# Patient Record
Sex: Male | Born: 1970 | Race: White | Hispanic: No | Marital: Married
Health system: Southern US, Community
[De-identification: ages and names within clinical notes are randomized; demographics above are authoritative.]

---

## 2001-07-23 ENCOUNTER — Emergency Department (HOSPITAL_COMMUNITY): Admission: EM | Admit: 2001-07-23 | Discharge: 2001-07-23 | Payer: Self-pay | Admitting: Emergency Medicine

## 2001-07-29 ENCOUNTER — Ambulatory Visit (HOSPITAL_COMMUNITY): Admission: RE | Admit: 2001-07-29 | Discharge: 2001-07-29 | Payer: Self-pay | Admitting: General Surgery

## 2002-07-08 ENCOUNTER — Emergency Department (HOSPITAL_COMMUNITY): Admission: EM | Admit: 2002-07-08 | Discharge: 2002-07-08 | Payer: Self-pay | Admitting: *Deleted

## 2003-11-26 ENCOUNTER — Encounter: Admission: RE | Admit: 2003-11-26 | Discharge: 2003-11-26 | Payer: Self-pay | Admitting: Occupational Medicine

## 2008-01-06 ENCOUNTER — Emergency Department (HOSPITAL_COMMUNITY): Admission: EM | Admit: 2008-01-06 | Discharge: 2008-01-06 | Payer: Self-pay | Admitting: Emergency Medicine

## 2010-02-22 IMAGING — CR DG SHOULDER 2+V*R*
3 series · 3 of 3 positions shown · non-contrast
Comparison: None

CLINICAL DATA: Right shoulder injury and pain.

RIGHT SHOULDER - 2+ VIEW

[view not recorded (1 of 3)]
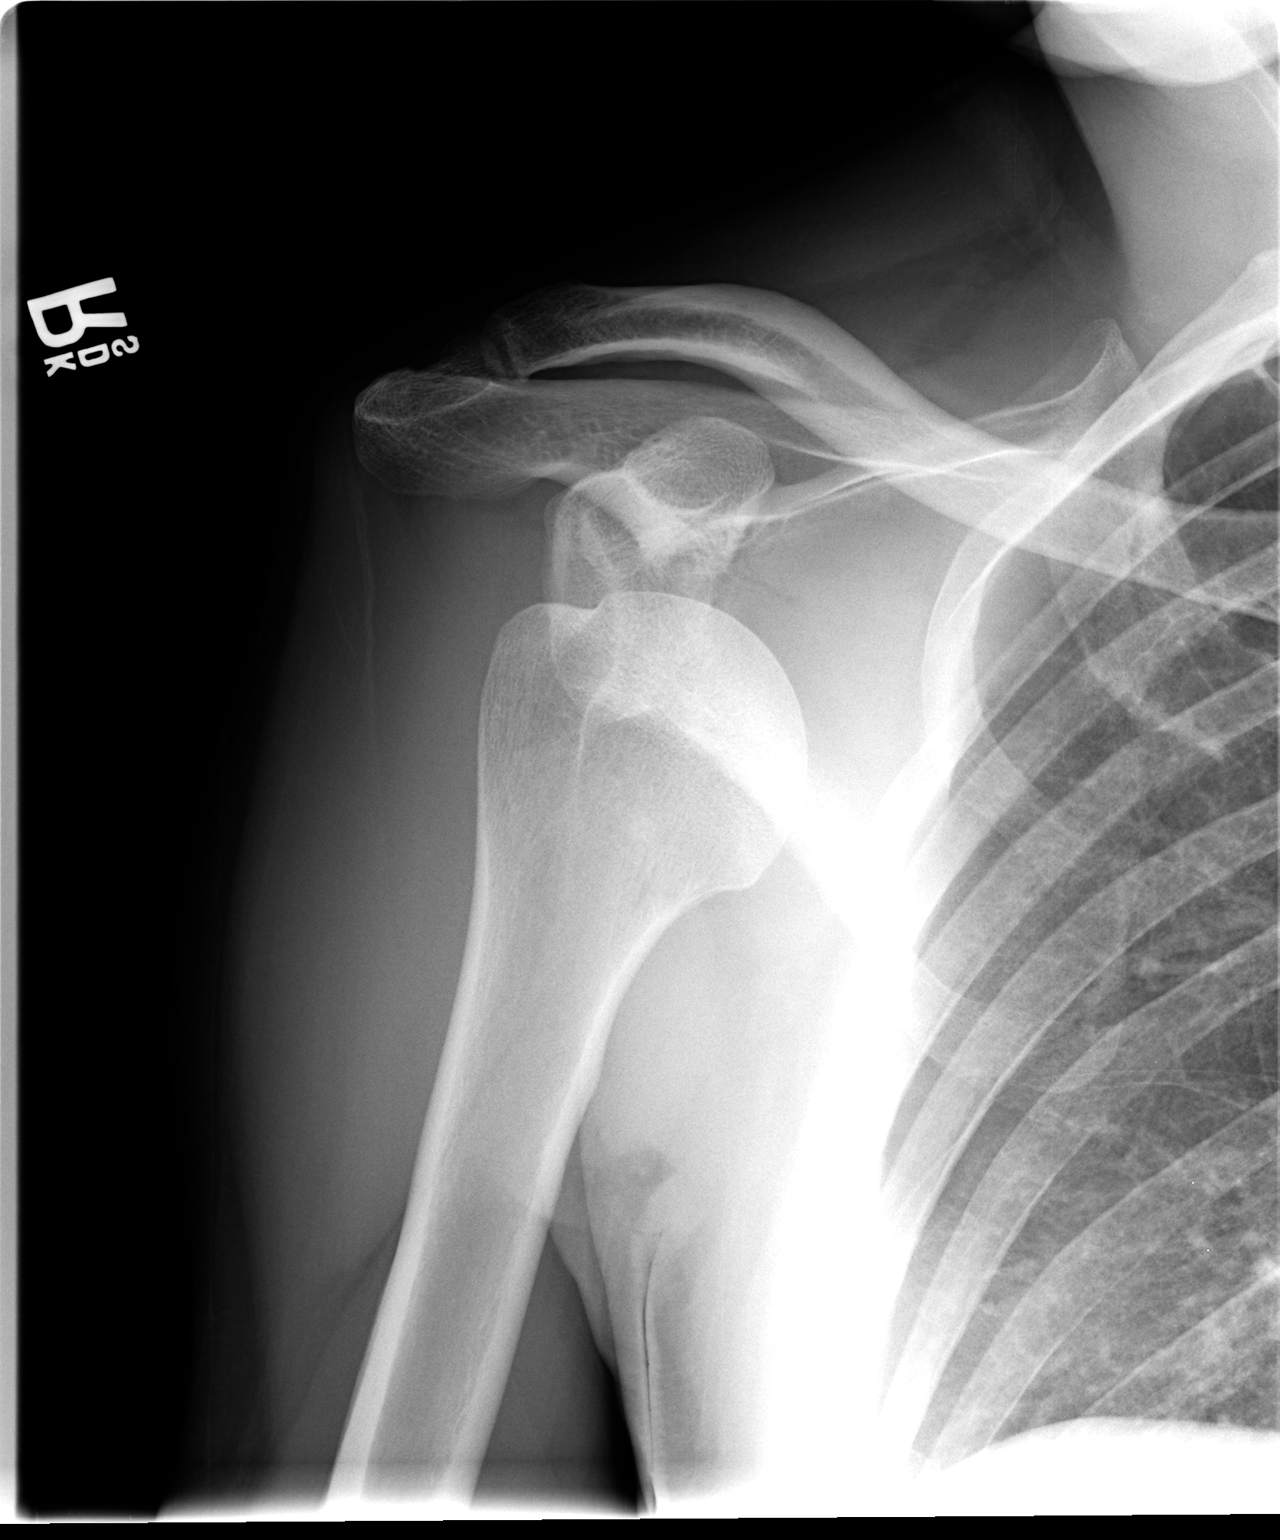

[view not recorded (2 of 3)]
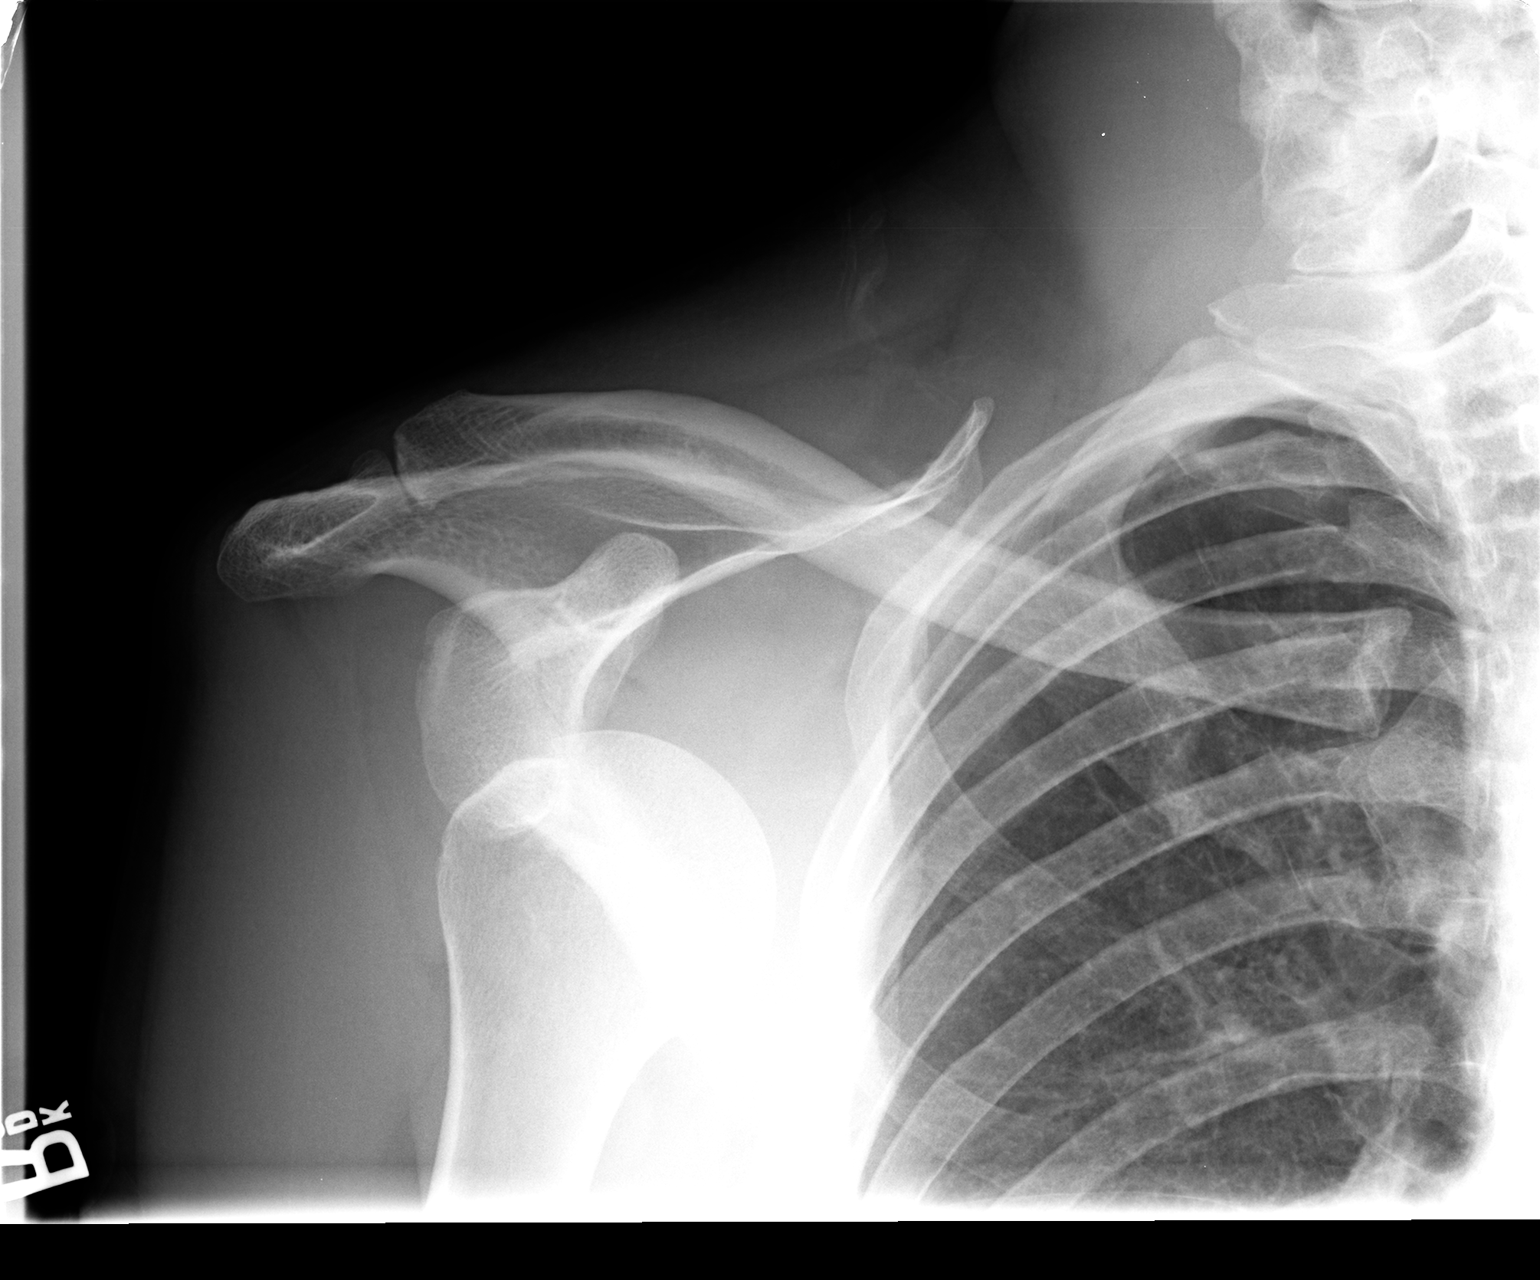

[view not recorded (3 of 3)]
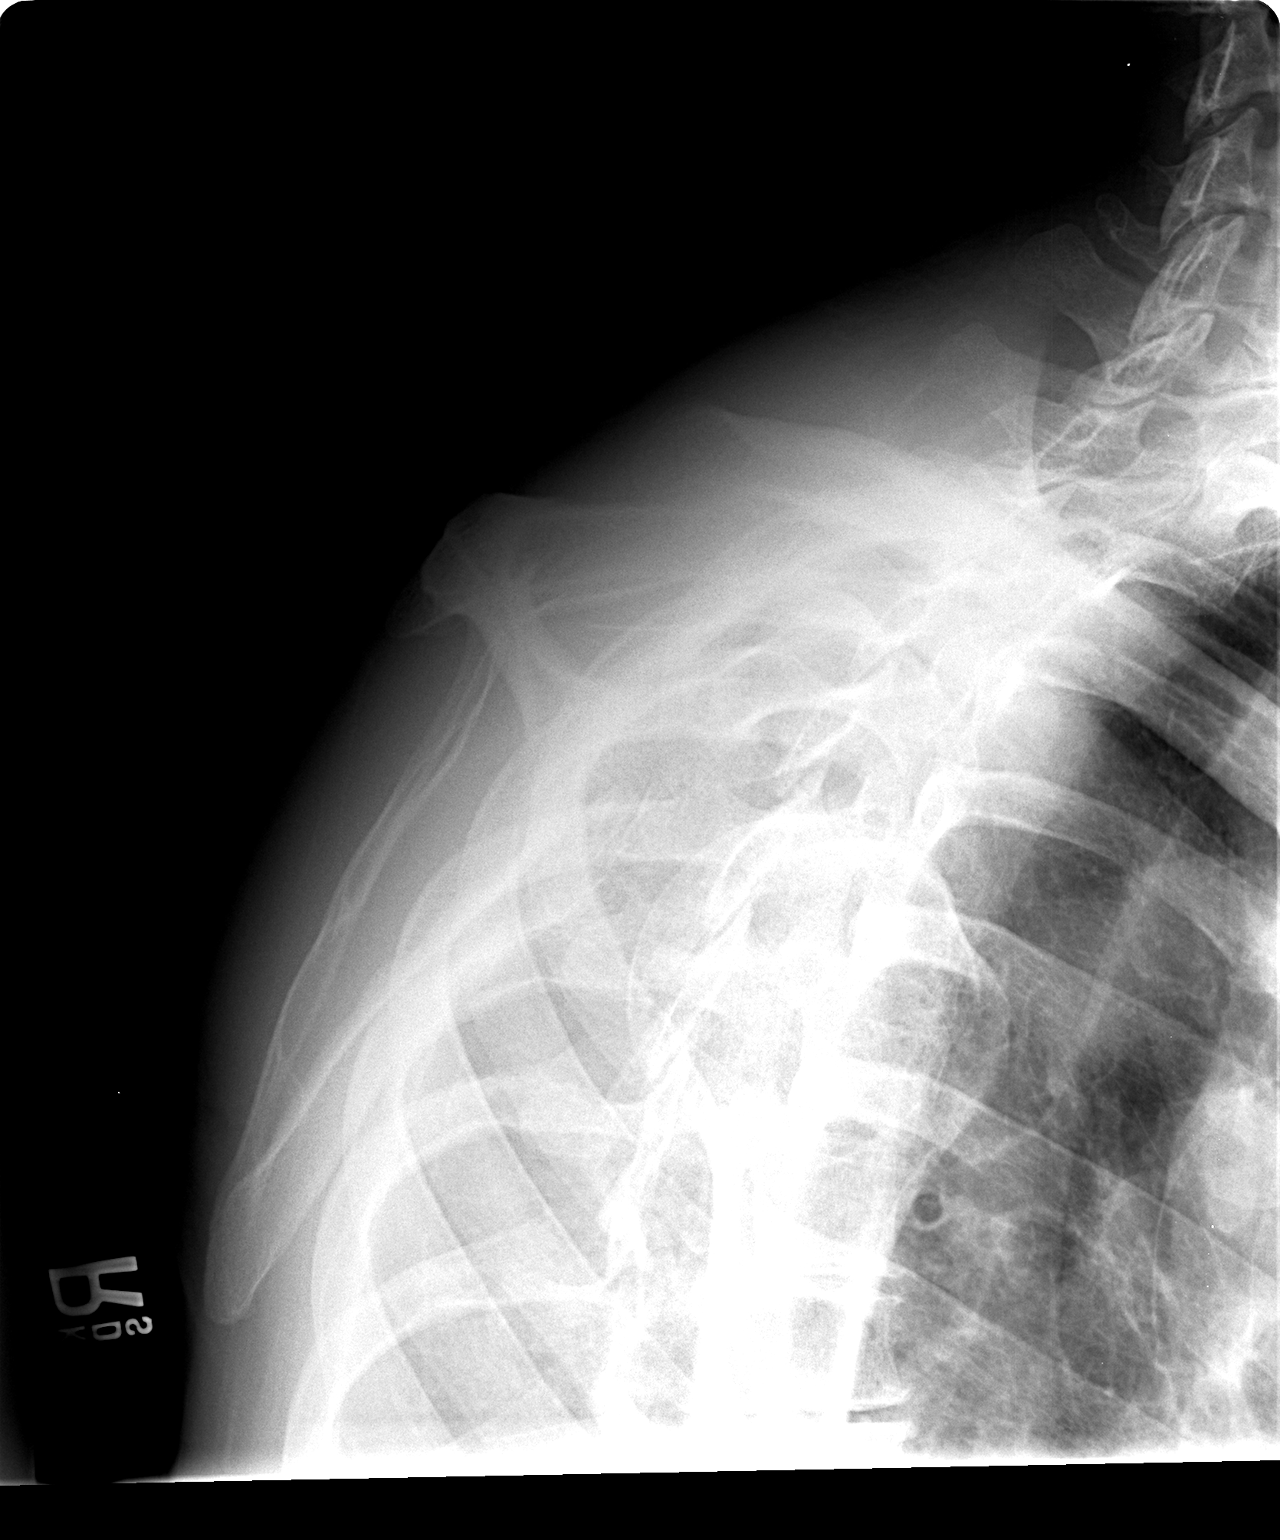

[3 of 3 positions shown; findings below may reference images not displayed]

FINDINGS: Anterior inferior dislocation of the humeral head is
noted.
There appears to be a mild Hill-Sachs deformity present.
No other fractures are present.
IMPRESSION: Anterior inferior dislocation of the right humeral head.

Question Hill-Sachs deformity.

## 2010-02-22 IMAGING — CR DG SHOULDER 2+V PORT*R*
2 series · 2 of 2 positions shown · non-contrast
Comparison: Right shoulder x-rays earlier in the evening 5250
hours.

CLINICAL DATA: Reduction of right shoulder dislocation.

PORTABLE RIGHT SHOULDER - 2+ VIEW [DATE]/7443 [id number] hours:

[view not recorded (1 of 2)]
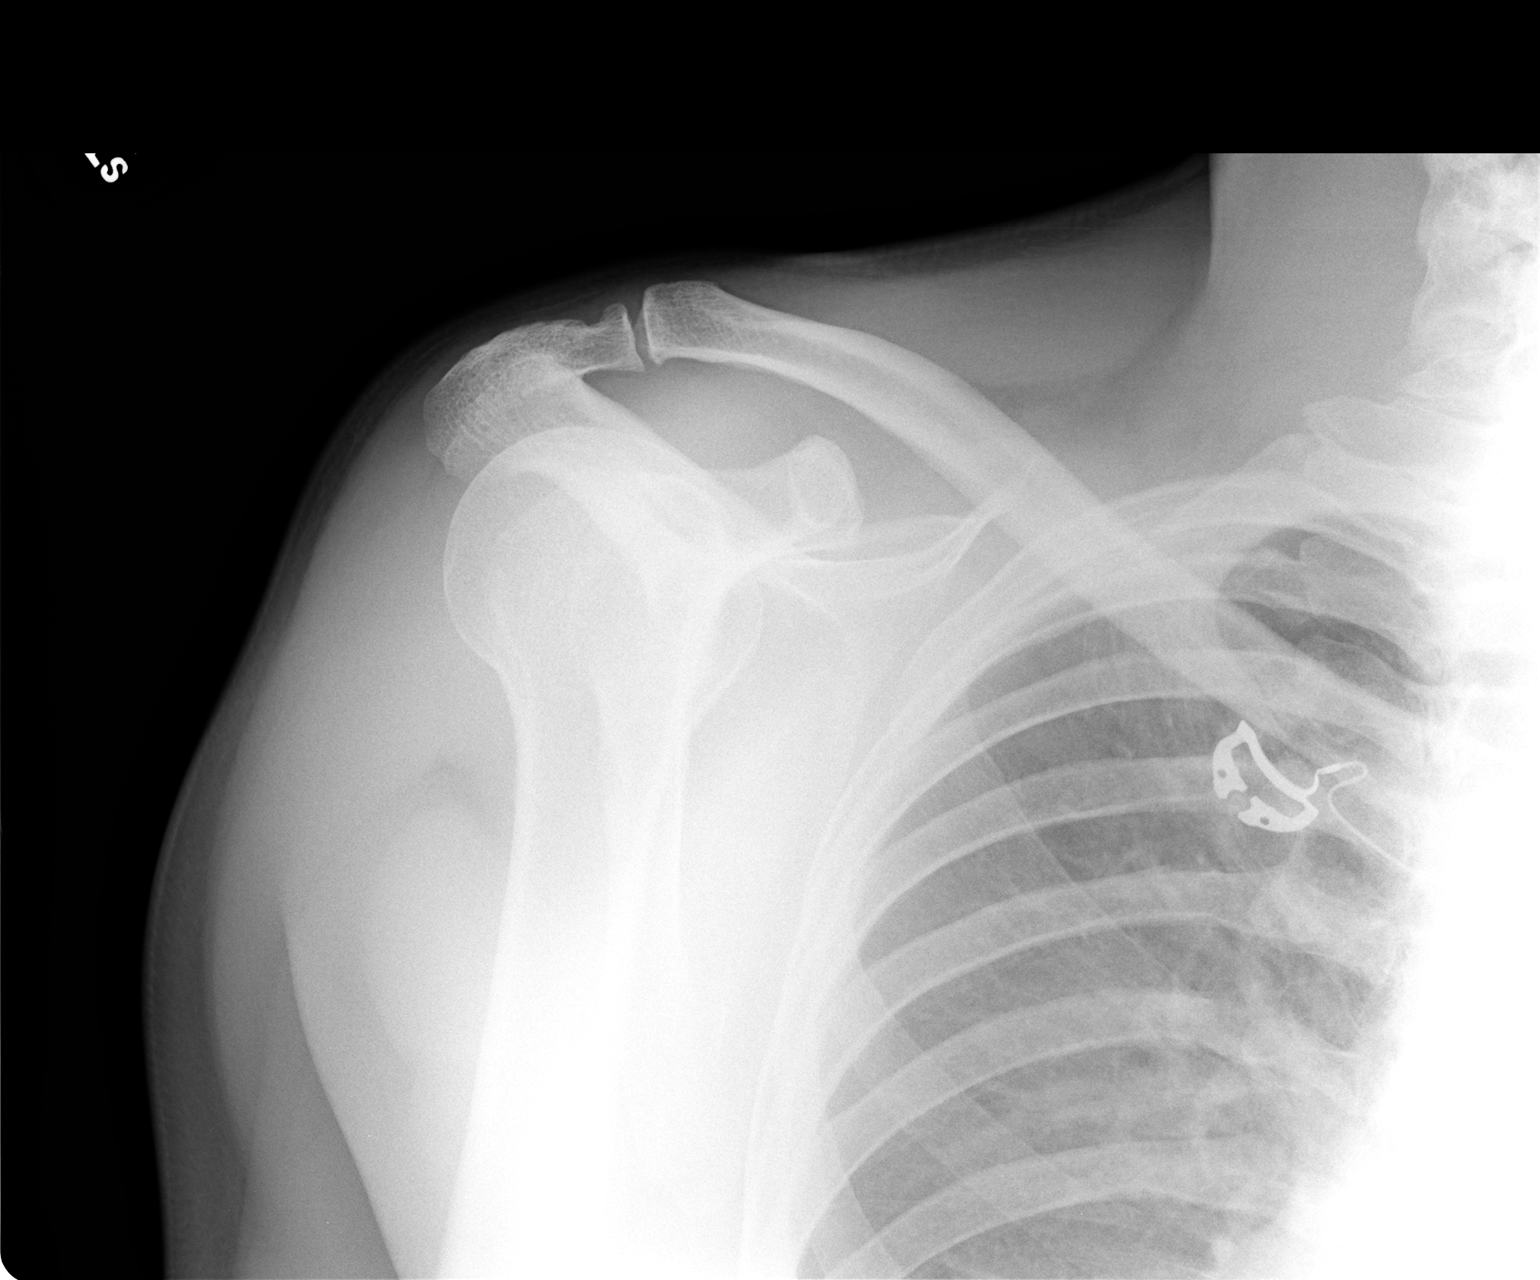

[view not recorded (2 of 2)]
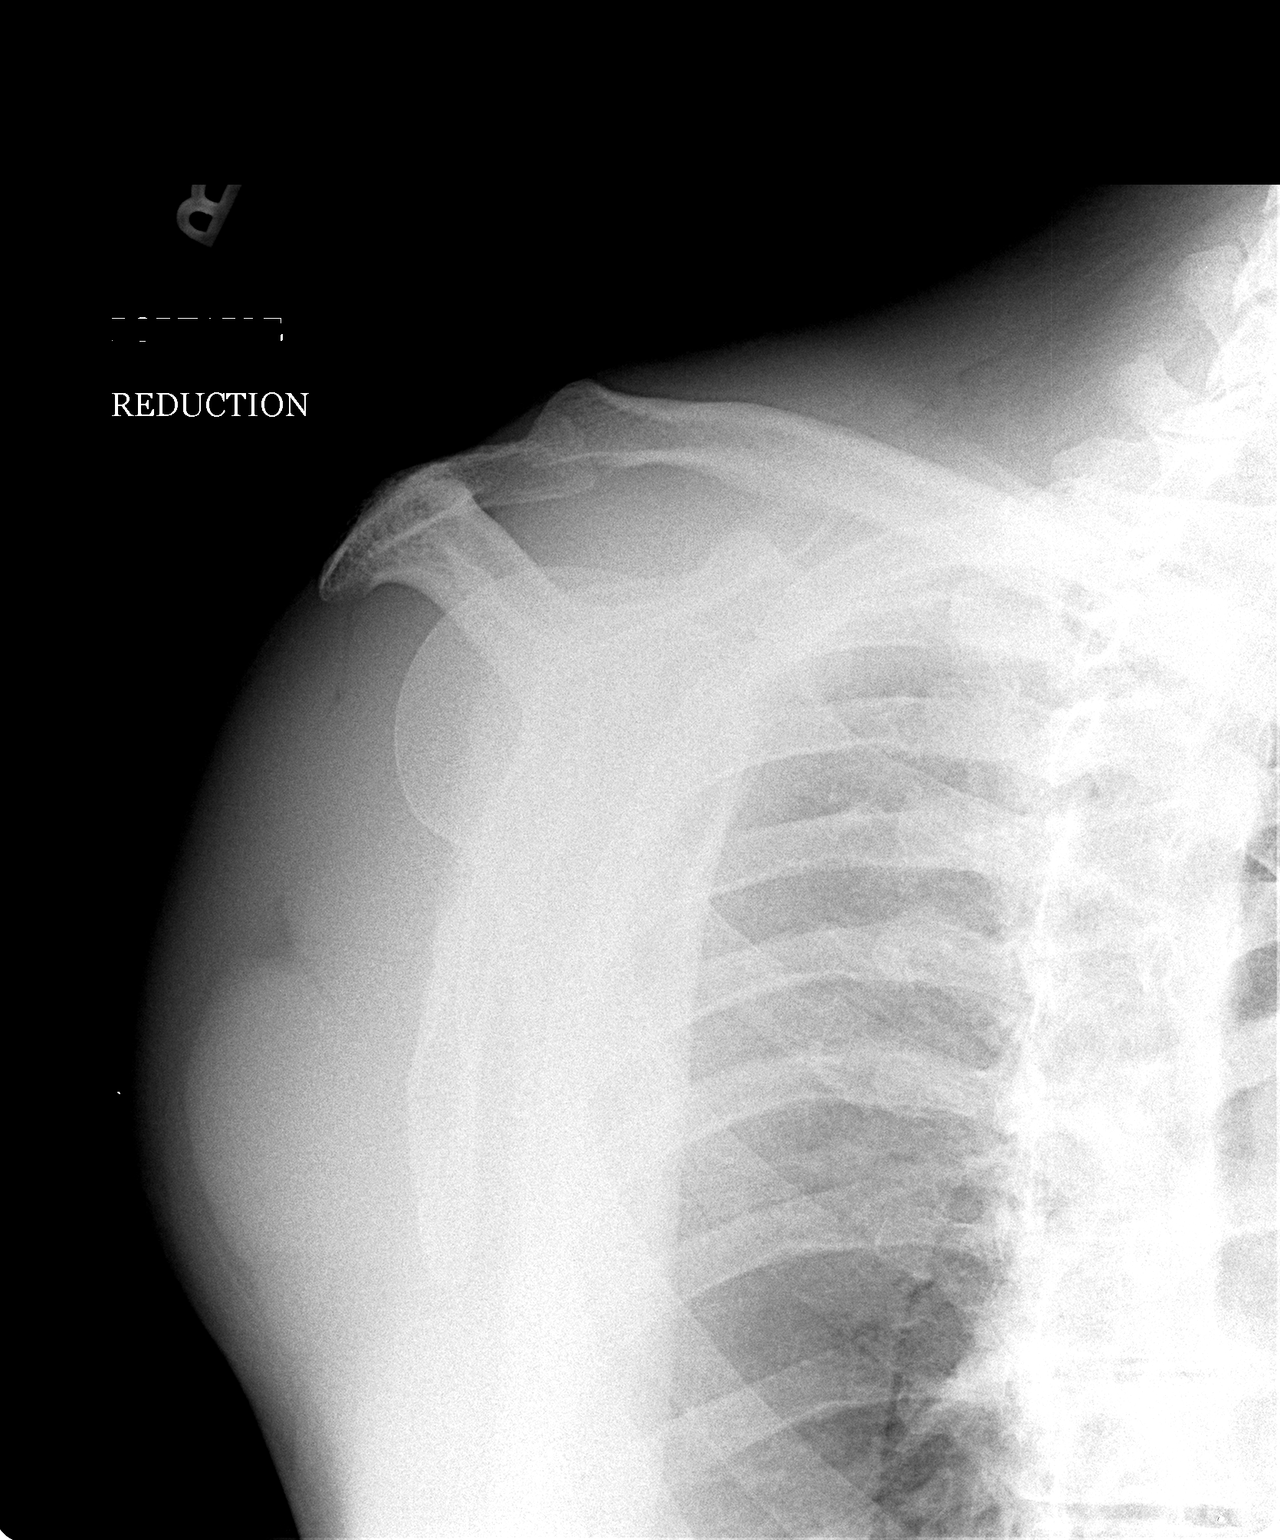

[2 of 2 positions shown; findings below may reference images not displayed]

FINDINGS: Anatomic alignment of the glenohumeral joint after
reduction.  No visible fractures; Hill-Sachs deformity suggested on
the initial images not visualized on these images.
Acromioclavicular joint intact.
IMPRESSION: Anatomic alignment of the glenohumeral joint status post reduction
of the shoulder dislocation.

## 2010-06-24 NOTE — Op Note (Signed)
[facility]  Patient:    CUTHBERT, TURTON Visit Number: [id number] MRN: [id number]          Service Type: DSU Location: DAY Attending Physician:  Dalia Heading Dictated by:   Franky Macho, M.D. Proc. Date: 07/29/01 Admit Date:  07/29/2001   CC:         Emergency Room   Operative Report  PREOPERATIVE DIAGNOSIS:  Left inguinal hernia.  POSTOPERATIVE DIAGNOSIS:  Left inguinal hernia.  PROCEDURE:  Left inguinal herniorrhaphy.  SURGEON:  Franky Macho, M.D.  ANESTHESIA:  General.  INDICATIONS:  The patient is a 40 year old white male who presented to the emergency room last week with an incarcerated left inguinal hernia. This was discerned in the emergency room and the patient now comes to the operating room for a left inguinal herniorrhaphy. The risks and benefits of the procedure including bleeding, infection, and recurrence of the hernia were fully explained to the patient, who gave informed consent.  PROCEDURE IN DETAIL:  The patient was placed in the supine position. After general anesthesia was administered, the left groin region was prepped and draped using the usual sterile technique with Betadine. Operative site confirmation was performed.  A left groin incision was made down the external oblique aponeurosis. The aponeurosis was incised to the external ring. The inguinal nerve was identified and retracted superiorly from the operative field. A Penrose drain was placed around the spermatic cord. The vas deferens was noted within the spermatic cord. The indirect hernia sac was freed away from the spermatic cord and inverted.  A medium size Marlex mesh plug was then placed in this region and secured to the transversalis fascia using a 2-0 Novofil interrupted suture. An onlay Marlex mesh patch was then placed along the floor of the inguinal canal and secured superiorly to the conjoined tendon and inferiorly to the shelving edge of  [place] lumen using a 2-0 Novofil interrupted suture. The internal ring was recreated using a 2-0 Novofil interrupted suture. The external oblique aponeurosis was closed using a 2-0 Vicryl running suture. The subcutaneous layers reapproximated using a 3-0 Vicryl interrupted suture. The skin was closed using a 4-0 Vicryl subcuticular suture. Sensorcaine 0.5% was instilled into the surrounding wound and the wound was covered with collodion.  All tape and needle counts were correct at the end of the procedure. The patient was awakened and transferred to PACU in stable condition. Complications none. Specimen none. Blood loss minimal. Dictated by:   Franky Macho, M.D. Attending Physician:  Dalia Heading DD:  07/29/01 TD:  07/30/01 Job: [id number]

## 2010-06-24 NOTE — Consult Note (Signed)
[facility]  Patient:    Philip Lyons, Philip Lyons Visit Number: [id number] MRN: [id number]          Service Type: EMS Location: ED Attending Physician:  Hilario Quarry Dictated by:   Franky Macho, M.D. Proc. Date: 07/23/01 Admit Date:  07/23/2001 Discharge Date: 07/23/2001   CC:         Margarita Grizzle, M.D., Emergency Room   Consultation Report  PATIENT AGE:  40 years old.  REFERRING PHYSICIAN:  Margarita Grizzle, M.D., Emergency Room  CHIEF COMPLAINT:  Incarcerated left inguinal hernia.  HISTORY OF PRESENT ILLNESS:  The patient is a 40 year old white male who states he has a history of a left inguinal hernia in the past who presented this evening with  pain in the left groin and inability to reduce the hernia. He denies any nausea or vomiting.  An attempt to reduce this in the emergency room was initially unsuccessful by the emergency department.  PAST MEDICAL HISTORY:  Unremarkable.  PAST SURGICAL HISTORY:  Unremarkable.  CURRENT MEDICATIONS:  None.  ALLERGIES:  CODEINE.  REVIEW OF SYSTEMS:  Unremarkable.  PHYSICAL EXAMINATION:  GENERAL:  The patient is a well-developed, well-nourished white male in mild discomfort.  He has been given sedation.  VITAL SIGNS:  He is afebrile.  Vital signs are stable.  ABDOMEN:  Soft, nontender, nondistended.  No hepatosplenomegaly or masses or noted.  A left groin hernia is noted.  This was reduced x 2 in the emergency room with only moderate difficulty.  GU:  Within normal limits.  LABORATORY DATA:  CBC was within normal limits.  IMPRESSION:  Left inguinal hernia, reduced.  PLAN:  I discussed with the patient that, due to the episode of incarceration, he should get the left inguinal hernia fixed.  He was instructed to follow up in my office in two days with his decision as to when to schedule surgery.  He as also given instructions on how to reduce the hernia on his own at home. Dictated by:   Franky Macho, M.D. Attending Physician:  Hilario Quarry DD:  07/23/01 TD:  07/24/01 Job: [id number]

## 2015-12-06 DIAGNOSIS — F410 Panic disorder [episodic paroxysmal anxiety] without agoraphobia: Secondary | ICD-10-CM | POA: Insufficient documentation

## 2015-12-06 DIAGNOSIS — F411 Generalized anxiety disorder: Secondary | ICD-10-CM | POA: Insufficient documentation

## 2016-02-29 DIAGNOSIS — K4090 Unilateral inguinal hernia, without obstruction or gangrene, not specified as recurrent: Secondary | ICD-10-CM | POA: Insufficient documentation

## 2016-03-09 DIAGNOSIS — A630 Anogenital (venereal) warts: Secondary | ICD-10-CM | POA: Insufficient documentation

## 2016-04-10 DIAGNOSIS — Z8719 Personal history of other diseases of the digestive system: Secondary | ICD-10-CM | POA: Insufficient documentation

## 2020-01-13 ENCOUNTER — Telehealth: Payer: Self-pay | Admitting: *Deleted

## 2020-01-13 ENCOUNTER — Encounter: Payer: Self-pay | Admitting: *Deleted

## 2020-01-13 NOTE — Telephone Encounter (Signed)
Pt started having rhinorrhea, mild sore throat evening of 12/5, Sunday. Woke up Monday with intermittently productive cough. Reports today chest congestion is improved. Noted cough with frequent throat clearing during phone call. Pt reports being afebrile per home thermometer. Rapid Covid test Monday was negative. Pt reported he was working outside over the weekend and felt sx were related to that. Denies any personal Hx of seasonal allergies but does endorse a Hx of "cold this time of year." Pt fully vaccinated.  Last day on-site: 01/09/20 Day 1 of Sx: 01/11/20 Day 1 of quarantine: 01/12/20 Testing between sx days 3-5 Test scheduled 01/15/20 at 1045 at [facility] Day 7 quarantine: 01/18/20 RTW date: 01/19/20  Will plan to f/u with pt over weekend for anticipated results and RTW clearance for 12/13.

## 2020-01-15 ENCOUNTER — Encounter: Payer: Self-pay | Admitting: *Deleted

## 2020-01-15 NOTE — Telephone Encounter (Signed)
Telephone message left for patient checking in to see if symptoms improving or worsening and if he was able to get his covid testing today as scheduled.  RN Rolly Salter will be in clinic tomorrow until noon and I can be reached at [email or web address]

## 2020-01-19 NOTE — Telephone Encounter (Signed)
RN informed by HR that pt is on 2nd shift and will return this afternoon at 1500.

## 2020-01-19 NOTE — Telephone Encounter (Signed)
Noted to RTW second shift today, covid test negative

## 2020-01-19 NOTE — Telephone Encounter (Signed)
Spoke with pt by phone. He reports improving sx with only some nasal drainage now. Denies cough, chest congestion. Feels overall improved. Reports negative result from 01/15/20 covid test. Cleared to RTW today. HR notified. Pt to discuss return date today vs tomorrow with supervisor as his typical shift is halfway done today.

## 2020-01-23 NOTE — Telephone Encounter (Signed)
Confirmed with pt's supervisor that pt did RTW 01/19/20 as expected.

## 2020-01-24 NOTE — Telephone Encounter (Signed)
Reviewed RN Rolly Salter note RTW as expected.

## 2020-06-08 ENCOUNTER — Encounter: Payer: Self-pay | Admitting: *Deleted

## 2020-06-08 ENCOUNTER — Telehealth: Payer: Self-pay | Admitting: *Deleted

## 2020-06-08 NOTE — Telephone Encounter (Signed)
Pt directed to clinic by supervisor. He reports some nausea upon waking this morning. Tried to vomit but couldn't. Started feeling better so he came in to work. Shortly thereafter, he began having diarrhea and for the past 2-3 hours has "been in the bathroom more than I'm working."  Sts his dinner last night may be causing the sx. No one else ate same food. Recommended hydration and Imodium for diarrhea. Advised pt he will need to go home and will be out until cleared by clinic to RTW. Will need at least 24 hours without sx without medications before returning. If new sx develop, or sx worsen or fail to improve, notify clinic. Reminded not to RTW until he has received clearance from clinic staff. He verbalizes understanding and agreement.

## 2020-06-08 NOTE — Telephone Encounter (Signed)
Reviewed RN Rolly Salter note, case discussed with RN Rolly Salter and patient in clinic today.  Agreed with plan of care.  I will contact patient tomorrow via telephone to re-evaluate.  Patient verbalized understanding information/instructions, agreed with plan of care and had no further questions at this time.

## 2020-06-09 NOTE — Telephone Encounter (Signed)
Patient reported rested then ate dinner yesterday.  Some abdomen pain after dinner first real meal he ate yesterday.  After stooling abdomen pain resolved in the evening.  Today his day off from work reported normal appetite/energy level and denied any symptoms that were occurring yesterday today.  Denied fever/chills/chest pain/dyspnea/shortness of breath/body aches/n/v/d or fatigue.  Patient cleared to return onsite tomorrow.  HR notified and no covid testing indicated at this time.  Patient verbalized understanding information/instructions, agreed with plan of care and had no further questions at this time.

## 2020-06-10 NOTE — Telephone Encounter (Signed)
Spoke with pt by phone. He confirms he did RTW today as planned. Denies any current sx. Closing encounter.

## 2020-06-10 NOTE — Telephone Encounter (Signed)
Reviewed RN Rolly Salter note and patient returned onsite as expected denied symptoms.

## 2020-10-08 DIAGNOSIS — E559 Vitamin D deficiency, unspecified: Secondary | ICD-10-CM | POA: Insufficient documentation

## 2020-10-14 DIAGNOSIS — E538 Deficiency of other specified B group vitamins: Secondary | ICD-10-CM | POA: Insufficient documentation

## 2020-10-21 ENCOUNTER — Encounter: Payer: Self-pay | Admitting: Registered Nurse

## 2020-10-21 ENCOUNTER — Telehealth: Payer: Self-pay | Admitting: Registered Nurse

## 2020-10-21 DIAGNOSIS — R252 Cramp and spasm: Secondary | ICD-10-CM

## 2020-10-21 NOTE — Telephone Encounter (Signed)
Patient returned call and stated PCM prescribed B12 shots, vitamin D pills 50,000 units weekly by mouth and he is to schedule nerve conduction study (calling PCM again tomorrow).  Reviewed patient labs Care Everywhere [facility] Hgba1c/electrolytes/CBC/TSH/PSA WNL.  B12 low normal and Vitamin D 30 one week and 20 the other week on testing.  Reviewed telehealth note from [facility] and patient was having leg cramps during exam suspected due to medication side effect.  Lexapro was stopped by [facility] and started on clonazepam 10/12/20.  Patient reported leg cramps started over a month ago.  He has tried stretching calf and hamstring muscles by holding onto kitchen counter and stretching one leg behind him.  The other stretch being standing on his toes.  He stated it isn't a normal muscle cramp but severely painful and is impacting his work and sleep.  Discussed with patient gastrocnemius, foot and hamstring stretches (sitting/lying/standing).  I would stop standing on tip toes stretches as could worsen calf cramps.  Patient to try epsom salt soak in tub before bedtime.  Stretches before bedtime.  Consider tonic water 1 serving if cramps not relieved with stretching and epsom salt soaks.  Discussed with patient quinine in tonic water can affect heart function so I do not recommend taking it daily.  If sweating a lot at work drink powerade/gatorade or eat soup or salt food as sweating/diarrhea can cause loss of electrolytes.  Patient picked up his vitamin D prescription today and will be starting it.  Discussed leg pain can occur with low vitamin D.  B12 deficiency can contribute to leg cramps along with anemia.  CBC normal/no anemia.  PCM ordered 4 vitamin B12 injections per patient and first two received yesterday and Wednesday prior (7&14 Sep).  When asked if any changes in skin color/rashes on legs or varicose veins.  Patient stated has 3-4 dots on side of one leg may have been bit by insect recently but otherwise no new  rashes/changes in skin e.g. jaundice/pigmentation or hot/cold to touch.  Discussed with patient peripheral vascular disease could also contribute to leg pain/muscle cramps.  Patient LDL cholesterol is elevated on most recent lab draw.  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  Patient did report biofreeze helped from [employer] topical when at work.  Discussed with patient to come to clinic when he is having cramps for re-evaluation.  Patient stated he tried today but RN Rolly Salter on vacation today and I did not arrive onsite until after his supervisor Westley Gambles already had instructed him to go home.  Patient reported there has been more than one day where he could not finish his entire work shift and this is a stressor to him.  Consider compression socks/massage.  Per epocrates half life of lexapro 32 hours. Per up to date arthralgias, myalgias, myoclonus, restless leg syndrome and limb pain are known to occur in less than 1% of patients. Patient instructed me that if spouse has questions or would like to discuss plan of care that he gives permission for me to discuss his medical care with his wife Sherrilyn Rist. Discussed with patient I can also do video visits if muscle cramps occurring when only RN [place] onsite.   Duration of call 32 minutes.  Patient A&Ox3 spoke full sentences without difficulty.  Patient verbalized understanding information/instructions, will try some of  the recommendations and will follow up with PCM.  I am unable to write any work restrictions/excuses for [employer] due to clinic contract limitations.

## 2020-10-21 NOTE — Telephone Encounter (Signed)
Patient left message for NP having leg cramps at work and at home day or night.  Saw PCM last week and would like appt with me today for re-evaluation.

## 2020-11-02 DIAGNOSIS — R252 Cramp and spasm: Secondary | ICD-10-CM | POA: Insufficient documentation

## 2020-11-18 NOTE — Telephone Encounter (Signed)
Reviewed RN Rolly Salter note pending neurology appt and patient started on mirapex and gabapentin by [facility]

## 2020-11-18 NOTE — Telephone Encounter (Addendum)
HR asked clinic to f/u with pt as he has remained out of work due to leg cramps and they have not heard from him. Called pt. No answer. LVMRCB.  Of note, reviewed notes from [facility] pcp. Has neurology appt 12/10/20. FMLA papers were started by pcp last week. Per their notes, no improvement of sx and have started him on gabapentin and Mirapex for off label uses.

## 2021-01-23 NOTE — Telephone Encounter (Signed)
Patient returned call stated plans to be at work tomorrow 01/24/21.  Mirapex resolved leg cramps.  Discussed with patient ensure hydrates well for first day back at work.  Continue leg stretches.  HR Tonya/Tim notified.  Patient reported he has paperwork for Tonya to complete [place] claim.  Patient A&Ox3 spoke full sentences without difficulty no cough/congestion/throat clearing audible during 4 minute telephone call.  Patient verbalized understanding information/instructions, agreed with plan of care and had no further questions at this time.  For GI bleed follow up  Had CSP 01/21/21 Two sessile polyps measuring 5-9 mm in the hepatic flexure; completely removed en bloc by cold snare and retrieved specimen One 8 mm pedunculated polyp in the descending colon; completely removed en bloc by cold snare and retrieved specimen One 3 mm sessile polyp in the distal rectum; performed complete en bloc removal by cold forceps biopsy  Multiple mild diverticula in the descending colon and sigmoid colon Medium, internal (grade 1) hemorrhoids  The exam was otherwise normal to the cecum.   Recommendation per GI  Await pathology results   High fiber diet for diverticulosis   Repeat colonoscopy in 3 years   Metamucil twice a day

## 2021-01-23 NOTE — Telephone Encounter (Signed)
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

## 2021-02-03 NOTE — Telephone Encounter (Signed)
Patient contacted via telephone and stated RTW as expected 01/24/21 and has been doing well only one evening after work leg pain but no further charlie horses or bad cramps and GI contacted him regarding pathology report on polyps 2 were precancerous and 2 benign and he is to schedule another colonoscopy in 3 years.  Patient had no further questions or concerns.  Patient A&Ox3 spoke full sentences without difficulty; respirations even and unlabored RA; no audible cough/congestion/throat clearing during 3 minute telephone call.  Follow up with PCM scheduled in the next week.  Encounter closed.

## 2021-05-26 ENCOUNTER — Encounter: Payer: Self-pay | Admitting: Registered Nurse

## 2021-05-26 ENCOUNTER — Ambulatory Visit: Payer: Self-pay | Admitting: Registered Nurse

## 2021-05-26 VITALS — BP 109/74 | HR 72 | Temp 98.2°F

## 2021-05-26 DIAGNOSIS — H6983 Other specified disorders of Eustachian tube, bilateral: Secondary | ICD-10-CM

## 2021-05-26 DIAGNOSIS — R519 Headache, unspecified: Secondary | ICD-10-CM

## 2021-05-26 DIAGNOSIS — J00 Acute nasopharyngitis [common cold]: Secondary | ICD-10-CM

## 2021-05-26 MED ORDER — LORATADINE 10 MG PO TABS: 10.0000 mg | ORAL_TABLET | Freq: Every day | ORAL | 11 refills | Status: AC

## 2021-05-26 MED ORDER — SALINE SPRAY 0.65 % NA SOLN: 2.0000 | NASAL | 0 refills | Status: AC

## 2021-05-26 MED ORDER — IBUPROFEN 800 MG PO TABS: 800.0000 mg | ORAL_TABLET | Freq: Three times a day (TID) | ORAL | 0 refills | Status: AC

## 2021-05-26 NOTE — Progress Notes (Signed)
? ?Subjective:  ? ? Patient ID: Philip Lyons, male    DOB: Apr 01, 1970, 51 y.o.   MRN: [id number] ? ?51y/o established male pt c/o HA, throbbing sensation at temples that started around 0830. Started sweating a lot, head felt hot then feeling clammy later around 1100. Was not doing any strenuous or heat related activity when sx started. Denies chest pain or ShOB. Denies Hx of HA.   Is working in [employer] that is not air-conditioned today.  Denied known sick contacts.  Denied history seasonal allergies.  Took 400mg  Ibuprofen approx 1030. No relief by now at noon.  Has not performed covid test.  Urine normal color yellow per patient.  Has been drinking water this am. ? ? ? ? ?Review of Systems  ?Constitutional:  Positive for diaphoresis. Negative for activity change, appetite change, fatigue and fever.  ?HENT:  Negative for sneezing, sore throat, trouble swallowing and voice change.   ?Eyes:  Negative for photophobia and visual disturbance.  ?Respiratory:  Negative for cough, shortness of breath, wheezing and stridor.   ?Cardiovascular:  Negative for chest pain.  ?Gastrointestinal:  Negative for diarrhea, nausea and vomiting.  ?Endocrine: Negative for cold intolerance and heat intolerance.  ?Genitourinary:  Negative for difficulty urinating.  ?Musculoskeletal:  Negative for arthralgias, back pain, gait problem, joint swelling, myalgias, neck pain and neck stiffness.  ?Skin:  Negative for color change, pallor, rash and wound.  ?Allergic/Immunologic: Negative for environmental allergies and food allergies.  ?Neurological:  Positive for headaches. Negative for dizziness, tremors, seizures, syncope, facial asymmetry, speech difficulty, weakness, light-headedness and numbness.  ?Hematological:  Negative for adenopathy. Does not bruise/bleed easily.  ?Psychiatric/Behavioral:  Negative for agitation, confusion and sleep disturbance.   ? ?   ?Objective:  ? Physical Exam ?Vitals and nursing note reviewed.   ?Constitutional:   ?   General: He is awake. He is not in acute distress. ?   Appearance: Normal appearance. He is well-developed, well-groomed and normal weight. He is not ill-appearing, toxic-appearing or diaphoretic.  ?HENT:  ?   Head: Normocephalic and atraumatic.  ?   Jaw: There is normal jaw occlusion. No trismus.  ?   Salivary Glands: Right salivary gland is not diffusely enlarged or tender. Left salivary gland is not diffusely enlarged or tender.  ?   Right Ear: Hearing, ear canal and external ear normal. No decreased hearing noted. No laceration, drainage, swelling or tenderness. A middle ear effusion is present. There is no impacted cerumen. No foreign body. No mastoid tenderness. No hemotympanum. Tympanic membrane is not injected, scarred, perforated, erythematous or retracted.  ?   Left Ear: Hearing, ear canal and external ear normal. No decreased hearing noted. No laceration, drainage, swelling or tenderness. A middle ear effusion is present. There is no impacted cerumen. No foreign body. No mastoid tenderness. No hemotympanum. Tympanic membrane is not injected, scarred, perforated, erythematous or retracted.  ?   Nose: No nasal deformity, septal deviation, laceration, congestion or rhinorrhea.  ?   Right Turbinates: Not enlarged, swollen or pale.  ?   Left Turbinates: Not enlarged, swollen or pale.  ?   Right Sinus: Maxillary sinus tenderness and frontal sinus tenderness present.  ?   Left Sinus: Maxillary sinus tenderness and frontal sinus tenderness present.  ?   Mouth/Throat:  ?   Lips: Pink. No lesions.  ?   Mouth: Mucous membranes are moist. Mucous membranes are not pale, not dry and not cyanotic. No lacerations, oral lesions or angioedema.  ?  Dentition: No gingival swelling, dental abscesses or gum lesions.  ?   Tongue: No lesions. Tongue does not deviate from midline.  ?   Palate: No mass and lesions.  ?   Pharynx: Uvula midline. Pharyngeal swelling and posterior oropharyngeal erythema  present. No oropharyngeal exudate or uvula swelling.  ?   Tonsils: No tonsillar exudate or tonsillar abscesses.  ?   Comments: Cobblestoning posterior pharynx; bilateral allergic shiners; bilateral lower eyelids swollen nonpitting edema 1-2+/4; clear discharge bilateral nasal turbinates; frontal equal to maxillary bilateral TTP ?Eyes:  ?   General: Lids are normal. Vision grossly intact. Gaze aligned appropriately. Allergic shiner present. No scleral icterus.    ?   Right eye: No foreign body, discharge or hordeolum.     ?   Left eye: No foreign body, discharge or hordeolum.  ?   Extraocular Movements: Extraocular movements intact.  ?   Right eye: Normal extraocular motion and no nystagmus.  ?   Left eye: Normal extraocular motion and no nystagmus.  ?   Conjunctiva/sclera: Conjunctivae normal.  ?   Right eye: Right conjunctiva is not injected. No chemosis, exudate or hemorrhage. ?   Left eye: Left conjunctiva is not injected. No chemosis, exudate or hemorrhage. ?   Pupils: Pupils are equal, round, and reactive to light. Pupils are equal.  ?   Right eye: Pupil is round and reactive.  ?   Left eye: Pupil is round and reactive.  ?Neck:  ?   Thyroid: No thyroid mass or thyromegaly.  ?   Trachea: Trachea and phonation normal. No tracheal tenderness or tracheal deviation.  ?Cardiovascular:  ?   Rate and Rhythm: Normal rate and regular rhythm.  ?   Pulses: Normal pulses.     ?     Radial pulses are 2+ on the right side and 2+ on the left side.  ?   Heart sounds: Normal heart sounds, S1 normal and S2 normal.  ?Pulmonary:  ?   Effort: Pulmonary effort is normal. No respiratory distress.  ?   Breath sounds: Normal breath sounds and air entry. No stridor or transmitted upper airway sounds. No decreased breath sounds, wheezing, rhonchi or rales.  ?   Comments: Spoke full sentences without difficulty; no cough observed in exam room; gait sure and steady in clinic ?Abdominal:  ?   General: Abdomen is flat. There is no distension.   ?   Palpations: Abdomen is soft.  ?Musculoskeletal:     ?   General: No tenderness. Normal range of motion.  ?   Right hand: Normal strength. Normal capillary refill.  ?   Left hand: Normal strength. Normal capillary refill.  ?   Cervical back: Normal range of motion and neck supple. No swelling, edema, deformity, erythema, signs of trauma, lacerations, rigidity, torticollis, tenderness or crepitus. No pain with movement, spinous process tenderness or muscular tenderness. Normal range of motion.  ?   Thoracic back: No swelling, edema, deformity, signs of trauma, lacerations or spasms. Normal range of motion.  ?   Right lower leg: No edema.  ?   Left lower leg: No edema.  ?Lymphadenopathy:  ?   Head:  ?   Right side of head: No submental, submandibular, tonsillar, preauricular, posterior auricular or occipital adenopathy.  ?   Left side of head: No submental, submandibular, tonsillar, preauricular, posterior auricular or occipital adenopathy.  ?   Cervical: No cervical adenopathy.  ?   Right cervical: No superficial, deep or posterior cervical  adenopathy. ?   Left cervical: No superficial, deep or posterior cervical adenopathy.  ?Skin: ?   General: Skin is warm and moist.  ?   Capillary Refill: Capillary refill takes less than 2 seconds.  ?   Coloration: Skin is not ashen, cyanotic, jaundiced, mottled, pale or sallow.  ?   Findings: No abrasion, abscess, acne, bruising, burn, ecchymosis, erythema, signs of injury, laceration, lesion, petechiae, rash or wound.  ?   Nails: There is no clubbing.  ?   Comments: Light sweat sheen on face and hat moist  ?Neurological:  ?   General: No focal deficit present.  ?   Mental Status: He is alert and oriented to person, place, and time. Mental status is at baseline.  ?   GCS: GCS eye subscore is 4. GCS verbal subscore is 5. GCS motor subscore is 6.  ?   Cranial Nerves: Cranial nerves 2-12 are intact. No cranial nerve deficit, dysarthria or facial asymmetry.  ?   Sensory:  Sensation is intact. No sensory deficit.  ?   Motor: Motor function is intact. No weakness, tremor, atrophy, abnormal muscle tone or seizure activity.  ?   Coordination: Coordination is intact. Coordination normal.  ?   Gait: G

## 2021-05-26 NOTE — Patient Instructions (Addendum)
Sinus Pain ? ?Sinus pain may occur when your sinuses become clogged or swollen. Sinuses are air-filled spaces in your skull that are behind the bones of your face and forehead. Sinus pain can range from mild to severe. ?What are the causes? ?Sinus pain can result from various conditions that affect the sinuses. Common causes include: ?Colds. ?Sinus infections. ?Allergies. ?What are the signs or symptoms? ?The main symptom of this condition is pain or pressure in your face, forehead, ears, or upper teeth. People who have sinus pain often have other symptoms, such as: ?Congested or runny nose. ?Fever. ?Inability to smell. ?Headache. ?Weather changes can make symptoms worse. ?How is this diagnosed? ?Your health care provider will diagnose this condition based on your symptoms and a physical exam. If you have pain that keeps coming back or does not go away, your health care provider may recommend more testing. This may include: ?Imaging tests, such as a CT scan or MRI, to check for problems with your sinuses. ?Examination of your sinuses using a thin tool with a camera that is inserted through your nose (endoscopy). ?How is this treated? ?Treatment for this condition depends on the cause. ?Sinus pain that is caused by a sinus infection may be treated with antibiotic medicine. ?Sinus pain that is caused by congestion may be helped by rinsing out (flushing) the nose and sinuses with saline solution. ?Sinus pain that is caused by allergies may be helped by allergy medicines (antihistamines) and medicated nasal sprays. ?Sinus surgery may be needed in some cases if other treatments do not help. ?Follow these instructions at home: ?General instructions ?If directed: ?Apply a warm, moist washcloth to your face to help relieve pain. ?Use a nasal saline wash. Follow the directions on the bottle or box. ?Hydrate and humidify ?Drink enough water to keep your urine clear or pale yellow. Staying hydrated will help to thin your  mucus. ?Use a humidifier if your home is dry. ?Inhale steam for 10-15 minutes, 3-4 times a day or as told by your health care provider. You can do this in the bathroom while a hot shower is running. ?Limit your exposure to cool or dry air. ?Medicines ? ?Take over-the-counter and prescription medicines only as told by your health care provider. ?If you were prescribed an antibiotic medicine, take it as told by your health care provider. Do not stop taking the antibiotic even if you start to feel better. ?If you have congestion, use a nasal spray to help lessen pressure. ?Contact a health care provider if: ?You have sinus pain more than one time a week. ?You have sensitivity to light or sound. ?You develop a fever. ?You feel nauseous or you vomit. ?Your sinus pain or headache does not get better with treatment. ?Get help right away if: ?You have vision problems. ?You have sudden, severe pain in your face or head. ?You have a seizure. ?You are confused. ?You have a stiff neck. ?Summary ?Sinus pain occurs when your sinuses become clogged or swollen. ?Sinus pain can result from various conditions that affect the sinuses, such as a cold, a sinus infection, or an allergy. ?Treatment for this condition depends on the cause. It may include medicine, such as antibiotics or antihistamines. ?This information is not intended to replace advice given to you by your health care provider. Make sure you discuss any questions you have with your health care provider. ?Document Revised: 12/26/2020 Document Reviewed: 12/26/2020 ?Elsevier Patient Education ? 2023 [employer] ?How to Perform a Sinus Rinse ?  A sinus rinse is a home treatment that is used to rinse your sinuses with a germ-free (sterile) mixture of salt and water (saline solution). Sinuses are air-filled spaces in your skull that are behind the bones of your face and forehead. They open into your nasal cavity. ?A sinus rinse can help to clear mucus, dirt, dust, or pollen  from your nasal cavity. You may do a sinus rinse when you have a cold, a virus, nasal allergy symptoms, a sinus infection, or stuffiness in your nose or sinuses. ?What are the risks? ?A sinus rinse is generally safe and effective. However, there are a few risks, which include: ?A burning sensation in your sinuses. This may happen if you do not make the saline solution as directed. Be sure to follow all directions when making the saline solution. ?Nasal irritation. ?Infection. This may be from unclean supplies or from contaminated water. Infection from contaminated water is rare, but possible. ?Do not do a sinus rinse if you have had ear or nasal surgery, ear infection, or plugged ears, unless recommended by your health care provider. ?Supplies needed: ?Saline solution or powder. ?Distilled or sterile water to mix with saline powder. ?You may use boiled and cooled tap water. Boil tap water for 5 minutes; cool until it is lukewarm. Use within 24 hours. ?Do not use regular tap water to mix with the saline solution. ?Neti pot or nasal rinse bottle. These supplies release the saline solution into your nose and through your sinuses. Neti pots and nasal rinse bottles can be purchased at [facility] local [facility], a health food store, or online. ?How to perform a sinus rinse ? ?Wash your hands with soap and water for at least 20 seconds. If soap and water are not available, use hand sanitizer. ?Wash your device according to the directions that came with the product and then dry it. ?Use the solution that comes with your product or one that is sold separately in stores. Follow the mixing directions on the package to mix with sterile or distilled water. ?Fill the device with the amount of saline solution noted in the device instructions. ?Stand by a sink and tilt your head sideways over the sink. ?Place the spout of the device in your upper nostril (the one closer to the ceiling). ?Gently pour or squeeze the saline solution into  your nasal cavity. The liquid should drain out from the lower nostril if you are not too congested. ?While rinsing, breathe through your open mouth. ?Gently blow your nose to clear any mucus and rinse solution. Blowing too hard may cause ear pain. ?Turn your head in the other direction and repeat in your other nostril. ?Clean and rinse your device with clean water and then air-dry it. ?Talk with your health care provider or pharmacist if you have questions about how to do a sinus rinse. ?Summary ?A sinus rinse is a home treatment that is used to rinse your sinuses with a sterile mixture of salt and water (saline solution). ?You may do a sinus rinse when you have a cold, a virus, nasal allergy symptoms, a sinus infection, or stuffiness in your nose or sinuses. ?A sinus rinse is generally safe and effective. Follow all instructions carefully. ?This information is not intended to replace advice given to you by your health care provider. Make sure you discuss any questions you have with your health care provider. ?Document Revised: 07/12/2020 Document Reviewed: 07/12/2020 ?Elsevier Patient Education ? 2023 [employer] ?Allergic Rhinitis, Adult ? ?Allergic rhinitis  is an allergic reaction that affects the mucous membrane inside the nose. The mucous membrane is the tissue that produces mucus. ?There are two types of allergic rhinitis: ?Seasonal. This type is also called hay fever and happens only during certain seasons. ?Perennial. This type can happen at any time of the year. ?Allergic rhinitis cannot be spread from person to person. This condition can be mild, moderate, or severe. It can develop at any age and may be outgrown. ?What are the causes? ?This condition is caused by allergens. These are things that can cause an allergic reaction. Allergens may differ for seasonal allergic rhinitis and perennial allergic rhinitis. ?Seasonal allergic rhinitis is triggered by pollen. Pollen can come from grasses, trees, and  weeds. ?Perennial allergic rhinitis may be triggered by: ?Dust mites. ?Proteins in a pet's urine, saliva, or dander. Dander is dead skin cells from a pet. ?Smoke, mold, or car fumes. ?What increases the risk? ?[place]
